# Patient Record
Sex: Female | Born: 1955 | Race: White | Hispanic: No | Marital: Single | Smoking: Current every day smoker
Health system: Southern US, Community
[De-identification: ages and names within clinical notes are randomized; demographics above are authoritative.]

## PROBLEM LIST (undated history)

## (undated) DIAGNOSIS — M810 Age-related osteoporosis without current pathological fracture: Secondary | ICD-10-CM

## (undated) HISTORY — PX: TOTAL HIP ARTHROPLASTY: SHX124

## (undated) HISTORY — PX: COLONOSCOPY: SHX174

---

## 2000-12-27 ENCOUNTER — Emergency Department (HOSPITAL_COMMUNITY): Admission: EM | Admit: 2000-12-27 | Discharge: 2000-12-28 | Payer: Self-pay | Admitting: Emergency Medicine

## 2000-12-28 ENCOUNTER — Encounter: Payer: Self-pay | Admitting: Emergency Medicine

## 2001-06-19 ENCOUNTER — Encounter: Payer: Self-pay | Admitting: Unknown Physician Specialty

## 2001-06-19 ENCOUNTER — Ambulatory Visit (HOSPITAL_COMMUNITY): Admission: RE | Admit: 2001-06-19 | Discharge: 2001-06-19 | Payer: Self-pay | Admitting: Unknown Physician Specialty

## 2005-11-01 ENCOUNTER — Other Ambulatory Visit: Admission: RE | Admit: 2005-11-01 | Discharge: 2005-11-01 | Payer: Self-pay | Admitting: Family Medicine

## 2016-04-16 DIAGNOSIS — E785 Hyperlipidemia, unspecified: Secondary | ICD-10-CM | POA: Insufficient documentation

## 2016-04-16 DIAGNOSIS — Z8659 Personal history of other mental and behavioral disorders: Secondary | ICD-10-CM | POA: Insufficient documentation

## 2016-04-16 DIAGNOSIS — F17200 Nicotine dependence, unspecified, uncomplicated: Secondary | ICD-10-CM | POA: Insufficient documentation

## 2016-04-16 NOTE — Progress Notes (Signed)
Office Visit Note  Patient: Lori Zimmerman             Date of Birth: [birth year]           MRN: [id number]             PCP: Kirstie Peri, MD Referring: Kirstie Peri, MD Visit Date: 04/19/2016 Occupation: Hair dresser    Subjective:  Pain in joints   History of Present Illness: Lori Zimmerman is a 61 y.o. female seen in consultation per request of her PCP. According to patient her symptoms a started well she was 61 years old with left hip pain. She states the symptoms gradually got worse. At age 77 she moved to [facility] and then was seen by her GYN who did x-rays and told her that she had hip arthritis. Later she was seen at [facility] orthopedics and was diagnosed with left hip joints severe arthritis. She had a hip joint injection under fluoroscopy. Surgery was not advised. 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 She was also given a course of prednisone. She states recently she's been having increased hip joint pain. Dr. Sherryll Burger gave her cortisone injection to the trochanteric area which helped her to some extent. She also complains of discomfort in her feet and has difficulty walking. She denies any joint swelling. None of the other joints are painful. She states she has to work long hours and she quit working in April 2017 due to joint discomfort. She is a still working as a Interior and spatial designer.  Activities of Daily Living:  Patient reports morning stiffness for 1 hour.   Patient Reports nocturnal pain.  Difficulty dressing/grooming: Denies Difficulty climbing stairs: Reports Difficulty getting out of chair: Reports Difficulty using hands for taps, buttons, cutlery, and/or writing: Denies   Review of Systems  Constitutional: Positive for fatigue. Negative for night sweats, weight gain, weight loss and weakness.  HENT: Positive for mouth dryness. Negative for mouth sores, trouble swallowing, trouble  swallowing and nose dryness.   Eyes: Negative for pain, redness, visual disturbance and dryness.  Respiratory: Negative for cough, shortness of breath and difficulty breathing.   Cardiovascular: Negative for chest pain, palpitations, hypertension, irregular heartbeat and swelling in legs/feet.  Gastrointestinal: Negative for blood in stool, constipation and diarrhea.  Endocrine: Negative for increased urination.  Genitourinary: Negative for vaginal dryness.  Musculoskeletal: Positive for arthralgias, joint pain and morning stiffness. Negative for joint swelling, myalgias, muscle weakness, muscle tenderness and myalgias.  Skin: Negative for color change, rash, hair loss, skin tightness, ulcers and sensitivity to sunlight.  Allergic/Immunologic: Negative for susceptible to infections.  Neurological: Negative for dizziness, memory loss and night sweats.  Hematological: Negative for swollen glands.  Psychiatric/Behavioral: Positive for depressed mood and sleep disturbance. The patient is nervous/anxious.     PMFS History:  Patient Active Problem List   Diagnosis Date Noted  . Dyslipidemia 04/16/2016  . History of anxiety 04/16/2016  . Smoker 04/16/2016    History reviewed. No pertinent past medical history.  History reviewed. No pertinent family history. History reviewed. No pertinent surgical history. Social History   Social History Narrative  . No narrative on file     Objective: Vital Signs: Resp 14   Ht 5\' 4"  (1.626 m)   Wt 162 lb (73.5 kg)   BMI 27.81 kg/m    Physical Exam  Constitutional: She is oriented to person, place, and time. She appears well-developed and well-nourished.  HENT:  Head: Normocephalic and atraumatic.  Eyes: Conjunctivae and EOM are normal.  Neck: Normal range of motion.  Cardiovascular: Normal rate, regular rhythm, normal heart sounds and intact distal pulses.   Pulmonary/Chest: Effort normal and breath sounds normal.  Abdominal: Soft. Bowel  sounds are normal.  Lymphadenopathy:    She has no cervical adenopathy.  Neurological: She is alert and oriented to person, place, and time.  Skin: Skin is warm and dry. Capillary refill takes less than 2 seconds.  Psychiatric: She has a normal mood and affect. Her behavior is normal.  Nursing note and vitals reviewed.    Musculoskeletal Exam: C-spine and thoracic spine good range of motion she has limited in full range of motion of her lumbar spine. No SI joint tenderness was noted. Shoulder joints elbow joints are good range of motion. She has PIP/DIP thickening in her hands with no synovitis no MCP joint synovitis was noted. Left hip joint had very limited range of motion. Right knee joint was  swollen and warm without any effusion. Ankle joints MTPs PIPs with good range of motion with no synovitis.  CDAI Exam: No CDAI exam completed.    Investigation: Findings:  09/26/2015 LDL 162, CBC normal    Imaging: Xr Hip Unilat W Or W/o Pelvis 2-3 Views Left  Result Date: 04/19/2016 Severe left hip joint narrowing. Impression: Findings consistent with severe osteoarthritis of the left hip  Xr Hand 2 View Left  Result Date: 04/19/2016 Osteopenia was noted. She has DIP PIP narrowing bilaterally consistent with osteoarthritis no MCP joint narrowing or erosive changes were noted. Impression findings consistent with osteoarthritis of the hand  Xr Hand 2 View Right  Result Date: 04/19/2016 Osteopenia was noted. She has DIP PIP narrowing bilaterally consistent with osteoarthritis no MCP joint narrowing or erosive changes were noted. Impression findings consistent with osteoarthritis of the hand  Xr Knee 3 View Right  Result Date: 04/19/2016 Mild medial compartment narrowing was noted in both knees. Right knee joint revealed intercondylar osteophytes. Mild patellofemoral narrowing was noted. No chondrocalcinosis was noted. Impression mild osteoarthritis of the knee joint was noted.  Xr Lumbar  Spine 2-3 Views  Result Date: 04/19/2016 Mild scoliosis was noted. She has multilevel spondylosis. Anterior spurring was noted. Facet joint arthropathy was noted. Some wedging of L1 and L2 was noted. Impression: Multilevel spondylosis and scoliosis of lumbar spine   Speciality Comments: No specialty comments available.    Procedures:  No procedures performed Allergies: Sulfa antibiotics   Assessment / Plan:     Visit Diagnoses: Pain in left hip -she has painful very limited range of motion. Plan: XR HIP UNILAT W OR W/O PELVIS 2-3 VIEWS LEFT. Severe osteoarthritis of left hip joint was noted.. Detailed discussion regarding total hip replacement. She states she will schedule appointment increased her orthopedics.  Chronic pain of right knee -she has some warmth and swelling. Plan: XR KNEE 3 VIEW RIGHT. She is only mild osteoarthritis in her knee joints I believe her right knee joint pain is most likely related to the left hip joint osteoarthritis  Pain in both hands - clinical findings are consistent with osteoarthritis Plan: XR Hand 2 View Right, XR Hand 2 View Left. 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 She does have this disease and the scoliosis she also has wedging of L1 and L2.   I recommend obtaining DEXA scan  as she is postmenopausal. Which she can get through her PCPs office.  Other fatigue  Dyslipidemia  History of anxiety: She is on Zoloft.  Smoker : Smoking cessation discussed.   Orders: Orders Placed This Encounter  Procedures  . XR KNEE 3 VIEW RIGHT  . XR HIP UNILAT W OR W/O PELVIS 2-3 VIEWS LEFT  . XR Hand 2 View Right  . XR Hand 2 View Left  . XR Lumbar Spine 2-3 Views   No orders of the defined types were placed in this encounter.   Face-to-face time spent with patient was 45 minutes. 50% of time was spent in counseling and  coordination of care.  Follow-Up Instructions: Return if symptoms worsen or fail to improve, for Osteoarthritis.   Pollyann SavoyShaili Tynan Boesel, MD  Note - This record has been created using Animal nutritionistDragon software.  Chart creation errors have been sought, but may not always  have been located. Such creation errors do not reflect on  the standard of medical care.

## 2016-04-19 ENCOUNTER — Ambulatory Visit (INDEPENDENT_AMBULATORY_CARE_PROVIDER_SITE_OTHER): Payer: Self-pay

## 2016-04-19 ENCOUNTER — Ambulatory Visit (INDEPENDENT_AMBULATORY_CARE_PROVIDER_SITE_OTHER): Payer: BLUE CROSS/BLUE SHIELD | Admitting: Rheumatology

## 2016-04-19 ENCOUNTER — Encounter: Payer: Self-pay | Admitting: Rheumatology

## 2016-04-19 VITALS — BP 132/82 | HR 78 | Resp 14 | Ht 64.0 in | Wt 162.0 lb

## 2016-04-19 DIAGNOSIS — E785 Hyperlipidemia, unspecified: Secondary | ICD-10-CM

## 2016-04-19 DIAGNOSIS — F17200 Nicotine dependence, unspecified, uncomplicated: Secondary | ICD-10-CM

## 2016-04-19 DIAGNOSIS — M545 Low back pain: Secondary | ICD-10-CM

## 2016-04-19 DIAGNOSIS — M79641 Pain in right hand: Secondary | ICD-10-CM | POA: Diagnosis not present

## 2016-04-19 DIAGNOSIS — M25561 Pain in right knee: Secondary | ICD-10-CM

## 2016-04-19 DIAGNOSIS — M25552 Pain in left hip: Secondary | ICD-10-CM | POA: Diagnosis not present

## 2016-04-19 DIAGNOSIS — Z8659 Personal history of other mental and behavioral disorders: Secondary | ICD-10-CM | POA: Diagnosis not present

## 2016-04-19 DIAGNOSIS — M79642 Pain in left hand: Secondary | ICD-10-CM

## 2016-04-19 DIAGNOSIS — R5383 Other fatigue: Secondary | ICD-10-CM | POA: Diagnosis not present

## 2016-04-19 DIAGNOSIS — G8929 Other chronic pain: Secondary | ICD-10-CM | POA: Diagnosis not present

## 2016-04-19 NOTE — Patient Instructions (Signed)
Please schedule appointment with the orthopedics for left total hip replacement.  Please schedule appointment with your PCP for bone density scan.

## 2016-04-29 ENCOUNTER — Telehealth: Payer: Self-pay | Admitting: Rheumatology

## 2016-04-29 NOTE — Telephone Encounter (Signed)
Patient is requetting her xrays from last visit be put  On a disc for her. She has [facility].

## 2016-05-21 ENCOUNTER — Ambulatory Visit: Payer: Self-pay | Admitting: Rheumatology

## 2019-02-08 ENCOUNTER — Ambulatory Visit: Payer: Self-pay | Admitting: *Deleted

## 2019-02-08 NOTE — Telephone Encounter (Signed)
Pt reports body aches, dry cough, headache, "Stuffiness", reports mild SOB with exertion only. Reports afebrile.  Pt has covid test scheduled for tomorrow AM. Symptom tier reviewed with pt as well as symptoms that warrant ED eval. Pt is treating with OTC meds. Advised to CB if any additional questions arise.

## 2019-02-08 NOTE — Telephone Encounter (Signed)
  Reason for Disposition . COVID-19 Testing, questions about  Answer Assessment - Initial Assessment Questions 1. COVID-19 DIAGNOSIS: "Who made your Coronavirus (COVID-19) diagnosis?" "Was it confirmed by a positive lab test?" If not diagnosed by a HCP, ask "Are there lots of cases (community spread) where you live?" (See public health department website, if unsure)    Health care worker 2. COVID-19 EXPOSURE: "Was there any known exposure to COVID before the symptoms began?" CDC Definition of close contact: within 6 feet (2 meters) for a total of 15 minutes or more over a 24-hour period.     yes 3. ONSET: "When did the COVID-19 symptoms start?"      weekend 4. WORST SYMPTOM: "What is your worst symptom?" (e.g., cough, fever, shortness of breath, muscle aches)     Body aches 5. COUGH: "Do you have a cough?" If so, ask: "How bad is the cough?"      moderate 6. FEVER: "Do you have a fever?" If so, ask: "What is your temperature, how was it measured, and when did it start?"    no 7. RESPIRATORY STATUS: "Describe your breathing?" (e.g., shortness of breath, wheezing, unable to speak)     mild 8. BETTER-SAME-WORSE: "Are you getting better, staying the same or getting worse compared to yesterday?"  If getting worse, ask, "In what way?"     same ER SYMPTOMS: "Do you have any other symptoms?"  (e.g., chills, fatigue, headache, loss of smell or taste, muscle pain, sore throat; new loss of smell or taste especially support the diagnosis of COVID-19)      Headache, muscle pain  Protocols used: CORONAVIRUS (COVID-19) DIAGNOSED OR SUSPECTED-A-AH

## 2020-03-20 ENCOUNTER — Other Ambulatory Visit (HOSPITAL_COMMUNITY): Payer: Self-pay | Admitting: Internal Medicine

## 2020-03-20 DIAGNOSIS — Z1231 Encounter for screening mammogram for malignant neoplasm of breast: Secondary | ICD-10-CM

## 2020-04-03 ENCOUNTER — Ambulatory Visit (HOSPITAL_COMMUNITY)
Admission: RE | Admit: 2020-04-03 | Discharge: 2020-04-03 | Disposition: A | Discharge status: Home / Self Care | Payer: 59 | Source: Ambulatory Visit | Attending: Internal Medicine | Admitting: Internal Medicine

## 2020-04-03 ENCOUNTER — Other Ambulatory Visit: Payer: Self-pay

## 2020-04-03 DIAGNOSIS — Z1231 Encounter for screening mammogram for malignant neoplasm of breast: Secondary | ICD-10-CM | POA: Diagnosis not present

## 2021-04-05 DIAGNOSIS — F419 Anxiety disorder, unspecified: Secondary | ICD-10-CM | POA: Diagnosis not present

## 2021-04-05 DIAGNOSIS — Z6832 Body mass index (BMI) 32.0-32.9, adult: Secondary | ICD-10-CM | POA: Diagnosis not present

## 2021-04-05 DIAGNOSIS — Z299 Encounter for prophylactic measures, unspecified: Secondary | ICD-10-CM | POA: Diagnosis not present

## 2021-04-05 DIAGNOSIS — K219 Gastro-esophageal reflux disease without esophagitis: Secondary | ICD-10-CM | POA: Diagnosis not present

## 2021-04-05 DIAGNOSIS — F17210 Nicotine dependence, cigarettes, uncomplicated: Secondary | ICD-10-CM | POA: Diagnosis not present

## 2021-04-05 DIAGNOSIS — E7800 Pure hypercholesterolemia, unspecified: Secondary | ICD-10-CM | POA: Diagnosis not present

## 2021-04-05 DIAGNOSIS — F339 Major depressive disorder, recurrent, unspecified: Secondary | ICD-10-CM | POA: Diagnosis not present

## 2021-05-23 ENCOUNTER — Other Ambulatory Visit (HOSPITAL_COMMUNITY): Payer: Self-pay | Admitting: Internal Medicine

## 2021-05-23 DIAGNOSIS — Z1231 Encounter for screening mammogram for malignant neoplasm of breast: Secondary | ICD-10-CM

## 2021-06-01 ENCOUNTER — Ambulatory Visit (HOSPITAL_COMMUNITY)
Admission: RE | Admit: 2021-06-01 | Discharge: 2021-06-01 | Disposition: A | Discharge status: Home / Self Care | Payer: PPO | Source: Ambulatory Visit | Attending: Internal Medicine | Admitting: Internal Medicine

## 2021-06-01 DIAGNOSIS — Z1231 Encounter for screening mammogram for malignant neoplasm of breast: Secondary | ICD-10-CM | POA: Diagnosis not present

## 2021-07-10 DIAGNOSIS — E559 Vitamin D deficiency, unspecified: Secondary | ICD-10-CM | POA: Diagnosis not present

## 2021-07-10 DIAGNOSIS — Z1339 Encounter for screening examination for other mental health and behavioral disorders: Secondary | ICD-10-CM | POA: Diagnosis not present

## 2021-07-10 DIAGNOSIS — Z7189 Other specified counseling: Secondary | ICD-10-CM | POA: Diagnosis not present

## 2021-07-10 DIAGNOSIS — Z1331 Encounter for screening for depression: Secondary | ICD-10-CM | POA: Diagnosis not present

## 2021-07-10 DIAGNOSIS — Z299 Encounter for prophylactic measures, unspecified: Secondary | ICD-10-CM | POA: Diagnosis not present

## 2021-07-10 DIAGNOSIS — Z6832 Body mass index (BMI) 32.0-32.9, adult: Secondary | ICD-10-CM | POA: Diagnosis not present

## 2021-07-10 DIAGNOSIS — E7800 Pure hypercholesterolemia, unspecified: Secondary | ICD-10-CM | POA: Diagnosis not present

## 2021-07-10 DIAGNOSIS — Z0000 Encounter for general adult medical examination without abnormal findings: Secondary | ICD-10-CM | POA: Diagnosis not present

## 2021-07-10 DIAGNOSIS — Z79899 Other long term (current) drug therapy: Secondary | ICD-10-CM | POA: Diagnosis not present

## 2021-07-10 DIAGNOSIS — R5383 Other fatigue: Secondary | ICD-10-CM | POA: Diagnosis not present

## 2021-10-09 DIAGNOSIS — Z0000 Encounter for general adult medical examination without abnormal findings: Secondary | ICD-10-CM | POA: Diagnosis not present

## 2021-10-09 DIAGNOSIS — K219 Gastro-esophageal reflux disease without esophagitis: Secondary | ICD-10-CM | POA: Diagnosis not present

## 2021-10-09 DIAGNOSIS — Z6832 Body mass index (BMI) 32.0-32.9, adult: Secondary | ICD-10-CM | POA: Diagnosis not present

## 2021-10-09 DIAGNOSIS — F339 Major depressive disorder, recurrent, unspecified: Secondary | ICD-10-CM | POA: Diagnosis not present

## 2021-10-09 DIAGNOSIS — Z299 Encounter for prophylactic measures, unspecified: Secondary | ICD-10-CM | POA: Diagnosis not present

## 2021-10-09 DIAGNOSIS — R350 Frequency of micturition: Secondary | ICD-10-CM | POA: Diagnosis not present

## 2021-10-09 DIAGNOSIS — E7800 Pure hypercholesterolemia, unspecified: Secondary | ICD-10-CM | POA: Diagnosis not present

## 2021-10-22 DIAGNOSIS — Z23 Encounter for immunization: Secondary | ICD-10-CM | POA: Diagnosis not present

## 2021-10-23 DIAGNOSIS — E2839 Other primary ovarian failure: Secondary | ICD-10-CM | POA: Diagnosis not present

## 2021-10-25 DIAGNOSIS — J069 Acute upper respiratory infection, unspecified: Secondary | ICD-10-CM | POA: Diagnosis not present

## 2021-10-25 DIAGNOSIS — Z299 Encounter for prophylactic measures, unspecified: Secondary | ICD-10-CM | POA: Diagnosis not present

## 2021-10-25 DIAGNOSIS — R059 Cough, unspecified: Secondary | ICD-10-CM | POA: Diagnosis not present

## 2021-10-25 DIAGNOSIS — R070 Pain in throat: Secondary | ICD-10-CM | POA: Diagnosis not present

## 2021-11-06 DIAGNOSIS — H40053 Ocular hypertension, bilateral: Secondary | ICD-10-CM | POA: Diagnosis not present

## 2022-01-07 DIAGNOSIS — H40053 Ocular hypertension, bilateral: Secondary | ICD-10-CM | POA: Diagnosis not present

## 2022-02-19 DIAGNOSIS — H25093 Other age-related incipient cataract, bilateral: Secondary | ICD-10-CM | POA: Diagnosis not present

## 2022-02-19 DIAGNOSIS — H40003 Preglaucoma, unspecified, bilateral: Secondary | ICD-10-CM | POA: Diagnosis not present

## 2022-02-19 DIAGNOSIS — H25813 Combined forms of age-related cataract, bilateral: Secondary | ICD-10-CM | POA: Diagnosis not present

## 2022-03-12 DIAGNOSIS — H25813 Combined forms of age-related cataract, bilateral: Secondary | ICD-10-CM | POA: Diagnosis not present

## 2022-03-12 DIAGNOSIS — H40033 Anatomical narrow angle, bilateral: Secondary | ICD-10-CM | POA: Diagnosis not present

## 2022-03-12 DIAGNOSIS — H40003 Preglaucoma, unspecified, bilateral: Secondary | ICD-10-CM | POA: Diagnosis not present

## 2022-04-09 DIAGNOSIS — K219 Gastro-esophageal reflux disease without esophagitis: Secondary | ICD-10-CM | POA: Diagnosis not present

## 2022-04-09 DIAGNOSIS — Z299 Encounter for prophylactic measures, unspecified: Secondary | ICD-10-CM | POA: Diagnosis not present

## 2022-04-09 DIAGNOSIS — R0602 Shortness of breath: Secondary | ICD-10-CM | POA: Diagnosis not present

## 2022-04-09 DIAGNOSIS — F17210 Nicotine dependence, cigarettes, uncomplicated: Secondary | ICD-10-CM | POA: Diagnosis not present

## 2022-04-09 DIAGNOSIS — R350 Frequency of micturition: Secondary | ICD-10-CM | POA: Diagnosis not present

## 2022-04-16 NOTE — H&P (Signed)
Surgical History & Physical  Patient Name: Lori Zimmerman  DOB: August 30, 1955  Surgery: Cataract extraction with intraocular lens implant phacoemulsification; Right Eye Surgeon: Ronn Melena MD Surgery Date: 04/26/2022 Pre-Op Date: 03/12/2022  HPI: A 40 Yr. old female patient who presents to complete glaucoma work up and have IOL measurements for cataract eval. She has no changes from her last visit. She does endorse glare with driving at night OU. This is negatively affecting the patient's quality of life and the patient is unable to function adequately in life with the current level of vision. No eye pain, redness, discharge.  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  Social Current some day smoker of Cigarettes   Medication Citracal D, Diclofenac, Effexor, Emergen, Famotidine, Rosuvastatin  Sx/Procedures Hip Replacement   Drug Allergies  Sulfa  History & Physical: Heent: cataracts NECK: supple without bruits LUNGS: lungs clear to auscultation CV: regular rate and rhythm Abdomen: soft and non-tender  Impression & Plan: Assessment: 1.  CATARACT AGE-RELATED COMBINED FORMS; Both Eyes (H25.813) 2.  GLAUCOMA SUSPECT; Both Eyes (H40.003) 3.  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  Cataracts are visually significant and account for the patient's complaints. Discussed all risks, benefits, procedures and  recovery, including infection, loss of vision and eye, need for glasses after surgery or additional procedures. Patient understands changing glasses will not improve vision. Patient indicated understanding of procedure. All questions answered. Patient desires to have surgery, recommend phacoemulsification with intraocular lens. Patient to have preliminary testing necessary (Argos/IOL Master, Mac OCT, TOPO) Educational materials provided.  Patient voices trouble with glare with night driving but also with narrow angle OU. Reasonable to move forward with cataract extraction to help open angle and prevent risk of angle closure per the Eagle Study  Plan: - Proceed with surgery OD followed by OS - Rayone Lens with best distance target - No fuchs, no prior eye surgery, no DM - ok with lying flat - Omidria, combo drop or 3 drops separately  2.  Glaucoma Suspect: - with thick cornea, moderately narrow angles, and elevated IOP - optic nerve, OCT RNFL, HVF reassuring - IOP better today  Plan: - Proceed with cataract surgery to open angle and assess IOP afterwards  Current gtts: None Intolerances: None Surgical Hx: None  Family Hx: Mother with glaucoma, Sister and Father with RP  Tmax: 55/25 Tcurrent: 18/20 Last DFE: 02/19/22 Pachy: 627/628  OCT (02/19/22): OD: full, ave rnfl thickness 95 OS: full, ave rnfl thickness 95  VF 24-2 (03/12/2022) OD: Full OU, no depression OS: Full OU, no depression  3.  High hyperopia OU, glasses per Dr. Rosana Hoes  4.  Can see SS in one quadrant of each eye, otherwise PTM

## 2022-04-18 ENCOUNTER — Encounter (HOSPITAL_COMMUNITY)
Admission: RE | Admit: 2022-04-18 | Discharge: 2022-04-18 | Disposition: A | Discharge status: Home / Self Care | Payer: 59 | Source: Ambulatory Visit | Attending: Optometry | Admitting: Optometry

## 2022-04-18 ENCOUNTER — Encounter (HOSPITAL_COMMUNITY): Payer: Self-pay

## 2022-04-18 HISTORY — DX: Age-related osteoporosis without current pathological fracture: M81.0

## 2022-05-08 ENCOUNTER — Other Ambulatory Visit (HOSPITAL_COMMUNITY): Payer: Self-pay | Admitting: Internal Medicine

## 2022-05-08 DIAGNOSIS — Z1231 Encounter for screening mammogram for malignant neoplasm of breast: Secondary | ICD-10-CM

## 2022-05-15 ENCOUNTER — Other Ambulatory Visit (HOSPITAL_COMMUNITY): Payer: Self-pay | Admitting: Internal Medicine

## 2022-05-15 ENCOUNTER — Ambulatory Visit (HOSPITAL_COMMUNITY)
Admission: RE | Admit: 2022-05-15 | Discharge: 2022-05-15 | Disposition: A | Discharge status: Home / Self Care | Payer: 59 | Source: Ambulatory Visit | Attending: Internal Medicine | Admitting: Internal Medicine

## 2022-05-15 DIAGNOSIS — R079 Chest pain, unspecified: Secondary | ICD-10-CM | POA: Diagnosis not present

## 2022-05-15 DIAGNOSIS — R0602 Shortness of breath: Secondary | ICD-10-CM

## 2022-05-24 ENCOUNTER — Encounter (HOSPITAL_COMMUNITY)
Admission: RE | Admit: 2022-05-24 | Discharge: 2022-05-24 | Disposition: A | Discharge status: Home / Self Care | Payer: 59 | Source: Ambulatory Visit | Attending: Optometry | Admitting: Optometry

## 2022-05-24 DIAGNOSIS — H2511 Age-related nuclear cataract, right eye: Secondary | ICD-10-CM | POA: Diagnosis not present

## 2022-05-31 ENCOUNTER — Ambulatory Visit (HOSPITAL_BASED_OUTPATIENT_CLINIC_OR_DEPARTMENT_OTHER): Payer: 59 | Admitting: Anesthesiology

## 2022-05-31 ENCOUNTER — Encounter (HOSPITAL_COMMUNITY)
Admission: RE | Disposition: A | Discharge status: Home / Self Care | Payer: Self-pay | Source: Home / Self Care | Attending: Optometry

## 2022-05-31 ENCOUNTER — Ambulatory Visit (HOSPITAL_COMMUNITY): Payer: 59 | Admitting: Anesthesiology

## 2022-05-31 ENCOUNTER — Ambulatory Visit (HOSPITAL_COMMUNITY)
Admission: RE | Admit: 2022-05-31 | Discharge: 2022-05-31 | Disposition: A | Discharge status: Home / Self Care | Payer: 59 | Attending: Optometry | Admitting: Optometry

## 2022-05-31 DIAGNOSIS — F419 Anxiety disorder, unspecified: Secondary | ICD-10-CM | POA: Diagnosis not present

## 2022-05-31 DIAGNOSIS — K219 Gastro-esophageal reflux disease without esophagitis: Secondary | ICD-10-CM | POA: Diagnosis not present

## 2022-05-31 DIAGNOSIS — H5203 Hypermetropia, bilateral: Secondary | ICD-10-CM | POA: Insufficient documentation

## 2022-05-31 DIAGNOSIS — F17210 Nicotine dependence, cigarettes, uncomplicated: Secondary | ICD-10-CM

## 2022-05-31 DIAGNOSIS — F17200 Nicotine dependence, unspecified, uncomplicated: Secondary | ICD-10-CM | POA: Diagnosis not present

## 2022-05-31 DIAGNOSIS — H2511 Age-related nuclear cataract, right eye: Secondary | ICD-10-CM | POA: Diagnosis not present

## 2022-05-31 DIAGNOSIS — H40033 Anatomical narrow angle, bilateral: Secondary | ICD-10-CM | POA: Diagnosis not present

## 2022-05-31 DIAGNOSIS — H25813 Combined forms of age-related cataract, bilateral: Secondary | ICD-10-CM | POA: Insufficient documentation

## 2022-05-31 DIAGNOSIS — H40003 Preglaucoma, unspecified, bilateral: Secondary | ICD-10-CM | POA: Insufficient documentation

## 2022-05-31 DIAGNOSIS — H25811 Combined forms of age-related cataract, right eye: Secondary | ICD-10-CM

## 2022-05-31 HISTORY — PX: CATARACT EXTRACTION W/PHACO: SHX586

## 2022-05-31 SURGERY — PHACOEMULSIFICATION, CATARACT, WITH IOL INSERTION
Anesthesia: Monitor Anesthesia Care | Site: Eye | Laterality: Right

## 2022-05-31 MED ORDER — POVIDONE-IODINE 5 % OP SOLN
OPHTHALMIC | Status: DC | PRN
  Administered 2022-05-31: 1 via OPHTHALMIC

## 2022-05-31 MED ORDER — TETRACAINE HCL 0.5 % OP SOLN
1.0000 [drp] | OPHTHALMIC | Status: AC
  Administered 2022-05-31 (×3): 1 [drp] via OPHTHALMIC

## 2022-05-31 MED ORDER — MIDAZOLAM HCL 2 MG/2ML IJ SOLN
INTRAMUSCULAR | Status: AC
  Filled 2022-05-31: qty 2

## 2022-05-31 MED ORDER — STERILE WATER FOR IRRIGATION IR SOLN
Status: DC | PRN
  Administered 2022-05-31: 250 mL

## 2022-05-31 MED ORDER — PHENYLEPHRINE-KETOROLAC 1-0.3 % IO SOLN
INTRAOCULAR | Status: AC
  Filled 2022-05-31: qty 4

## 2022-05-31 MED ORDER — PHENYLEPHRINE HCL 2.5 % OP SOLN
1.0000 [drp] | OPHTHALMIC | Status: AC
  Administered 2022-05-31 (×3): 1 [drp] via OPHTHALMIC

## 2022-05-31 MED ORDER — PHENYLEPHRINE-KETOROLAC 1-0.3 % IO SOLN
INTRAOCULAR | Status: DC | PRN
  Administered 2022-05-31: 500 mL via OPHTHALMIC

## 2022-05-31 MED ORDER — TROPICAMIDE 1 % OP SOLN
1.0000 [drp] | OPHTHALMIC | Status: AC
  Administered 2022-05-31 (×3): 1 [drp] via OPHTHALMIC

## 2022-05-31 MED ORDER — BSS IO SOLN
INTRAOCULAR | Status: DC | PRN
  Administered 2022-05-31: 15 mL via INTRAOCULAR

## 2022-05-31 MED ORDER — LIDOCAINE HCL (PF) 1 % IJ SOLN
INTRAMUSCULAR | Status: DC | PRN
  Administered 2022-05-31: 1 mL

## 2022-05-31 MED ORDER — MIDAZOLAM HCL 5 MG/5ML IJ SOLN
INTRAMUSCULAR | Status: DC | PRN
  Administered 2022-05-31: 2 mg via INTRAVENOUS

## 2022-05-31 MED ORDER — LIDOCAINE HCL 3.5 % OP GEL
1.0000 | Freq: Once | OPHTHALMIC | Status: AC
  Administered 2022-05-31: 1 via OPHTHALMIC

## 2022-05-31 MED ORDER — SIGHTPATH DOSE#1 NA HYALUR & NA CHOND-NA HYALUR IO KIT
PACK | INTRAOCULAR | Status: DC | PRN
  Administered 2022-05-31: 1 via OPHTHALMIC

## 2022-05-31 MED ORDER — MIDAZOLAM HCL 2 MG/2ML IJ SOLN
2.0000 mg | Freq: Once | INTRAMUSCULAR | Status: AC
  Administered 2022-05-31: 2 mg via INTRAVENOUS

## 2022-05-31 SURGICAL SUPPLY — 15 items
CATARACT SUITE SIGHTPATH (MISCELLANEOUS) ×1 IMPLANT
CLOTH BEACON ORANGE TIMEOUT ST (SAFETY) ×1 IMPLANT
DRSG TEGADERM 4X4.75 (GAUZE/BANDAGES/DRESSINGS) ×1 IMPLANT
EYE SHIELD UNIVERSAL CLEAR (GAUZE/BANDAGES/DRESSINGS) IMPLANT
FEE CATARACT SUITE SIGHTPATH (MISCELLANEOUS) ×1 IMPLANT
GLOVE BIOGEL PI IND STRL 7.0 (GLOVE) ×2 IMPLANT
LENS IOL RAYNER 24.5 (Intraocular Lens) ×1 IMPLANT
LENS IOL RAYONE EMV 24.5 (Intraocular Lens) IMPLANT
NDL HYPO 18GX1.5 BLUNT FILL (NEEDLE) ×1 IMPLANT
NEEDLE HYPO 18GX1.5 BLUNT FILL (NEEDLE) ×1 IMPLANT
PAD ARMBOARD 7.5X6 YLW CONV (MISCELLANEOUS) ×1 IMPLANT
RING MALYGIN 7.0 (MISCELLANEOUS) IMPLANT
SYR TB 1ML LL NO SAFETY (SYRINGE) ×1 IMPLANT
TAPE SURG TRANSPORE 1 IN (GAUZE/BANDAGES/DRESSINGS) IMPLANT
WATER STERILE IRR 250ML POUR (IV SOLUTION) ×1 IMPLANT

## 2022-05-31 NOTE — Anesthesia Postprocedure Evaluation (Signed)
Anesthesia Post Note  Patient: Chayil Gantt  Procedure(s) Performed: CATARACT EXTRACTION PHACO AND INTRAOCULAR LENS PLACEMENT (IOC) (Right: Eye)  Patient location during evaluation: Phase II Anesthesia Type: MAC Level of consciousness: awake and alert and oriented Pain management: pain level controlled Vital Signs Assessment: post-procedure vital signs reviewed and stable Respiratory status: spontaneous breathing, nonlabored ventilation and respiratory function stable Cardiovascular status: blood pressure returned to baseline and stable Postop Assessment: no apparent nausea or vomiting Anesthetic complications: no  No notable events documented.   Last Vitals:  Vitals:   05/31/22 0745 05/31/22 0819  BP: 118/85 115/78  Pulse: 86 87  Resp: 15 20  Temp:  36.6 C  SpO2: 95% 99%    Last Pain:  Vitals:   05/31/22 0819  TempSrc: Oral  PainSc: 0-No pain                 Arthor Gorter C Hjalmar Ballengee

## 2022-05-31 NOTE — Anesthesia Preprocedure Evaluation (Signed)
Anesthesia Evaluation  Patient identified by MRN, date of birth, ID band Patient awake    Reviewed: Allergy & Precautions, H&P , NPO status , Patient's Chart, lab work & pertinent test results  Airway Mallampati: II  TM Distance: >3 FB Neck ROM: Full    Dental  (+) Dental Advisory Given, Caps   Pulmonary Current Smoker and Patient abstained from smoking.   Pulmonary exam normal breath sounds clear to auscultation       Cardiovascular negative cardio ROS Normal cardiovascular exam Rhythm:Regular Rate:Normal     Neuro/Psych  PSYCHIATRIC DISORDERS Anxiety     negative neurological ROS     GI/Hepatic Neg liver ROS,GERD  Medicated and Controlled,,  Endo/Other  negative endocrine ROS    Renal/GU negative Renal ROS  negative genitourinary   Musculoskeletal negative musculoskeletal ROS (+)    Abdominal   Peds negative pediatric ROS (+)  Hematology negative hematology ROS (+)   Anesthesia Other Findings   Reproductive/Obstetrics negative OB ROS                             Anesthesia Physical Anesthesia Plan  ASA: 2  Anesthesia Plan: MAC   Post-op Pain Management: Minimal or no pain anticipated   Induction: Intravenous  PONV Risk Score and Plan: 0 and Treatment may vary due to age or medical condition  Airway Management Planned: Natural Airway and Nasal Cannula  Additional Equipment:   Intra-op Plan:   Post-operative Plan:   Informed Consent: I have reviewed the patients History and Physical, chart, labs and discussed the procedure including the risks, benefits and alternatives for the proposed anesthesia with the patient or authorized representative who has indicated his/her understanding and acceptance.     Dental advisory given  Plan Discussed with: CRNA and Surgeon  Anesthesia Plan Comments:        Anesthesia Quick Evaluation

## 2022-05-31 NOTE — Transfer of Care (Signed)
Immediate Anesthesia Transfer of Care Note  Patient: Lori Zimmerman  Procedure(s) Performed: CATARACT EXTRACTION PHACO AND INTRAOCULAR LENS PLACEMENT (IOC) (Right: Eye)  Patient Location: PACU  Anesthesia Type:MAC  Level of Consciousness: awake, alert , oriented, and patient cooperative  Airway & Oxygen Therapy: Patient Spontanous Breathing  Post-op Assessment: Report given to RN, Post -op Vital signs reviewed and stable, and Patient moving all extremities X 4  Post vital signs: Reviewed and stable  Last Vitals:  Vitals Value Taken Time  BP /   Temp wnl   Pulse 85   Resp 17   SpO2 99     Last Pain:  Vitals:   05/31/22 0707  TempSrc: Oral  PainSc: 0-No pain         Complications: No notable events documented.

## 2022-05-31 NOTE — Op Note (Signed)
Date of procedure: 05/31/22  Pre-operative diagnosis: Visually significant age-related nuclear cataract, Right Eye (H25.11)  Post-operative diagnosis: Visually significant age-related nuclear cataract, Right Eye  Procedure: Removal of cataract via phacoemulsification and insertion of intra-ocular lens Rayner [id number] +24.5D into the capsular bag of the Right Eye  Attending surgeon: Pecolia Ades, MD  Anesthesia: MAC, Topical Akten  Complications: None  Estimated Blood Loss: <73mL (minimal)  Specimens: None  Implants:  Implant Name Type Inv. Item Serial No. Manufacturer Lot No. LRB No. Used Action  LENS IOL RAYNER 24.5 - [id number] Intraocular Lens LENS IOL RAYNER 24.5  SIGHTPATH [id number] Right 1 Implanted    Indications:  Visually significant age-related cataract, Right Eye  Procedure:  The patient was seen and identified in the pre-operative area. The operative eye was identified and dilated.  The operative eye was marked.  Topical anesthesia was administered to the operative eye.     The patient was then to the operative suite and placed in the supine position.  A timeout was performed confirming the patient, procedure to be performed, and all other relevant information.   The patient's face was prepped and draped in the usual fashion for intra-ocular surgery.  A lid speculum was placed into the operative eye and the surgical microscope moved into place and focused.  A superotemporal paracentesis was created using a 20 gauge paracentesis blade.  BSS mixed with Omidria, followed by 1% lidocaine was injected into the anterior chamber.  Viscoelastic was injected into the anterior chamber.  A temporal clear-corneal main wound incision was created using a 2.48mm microkeratome.  A continuous curvilinear capsulorrhexis was initiated using an irrigating cystitome and completed using capsulorrhexis forceps.  Hydrodissection and hydrodeliniation were performed.  Viscoelastic was injected into  the anterior chamber.  A phacoemulsification handpiece and a chopper as a second instrument were used to remove the nucleus and epinucleus. The irrigation/aspiration handpiece was used to remove any remaining cortical material.   The capsular bag was reinflated with viscoelastic, checked, and found to be intact.  The intraocular lens was inserted into the capsular bag.  The irrigation/aspiration handpiece was used to remove any remaining viscoelastic.  The clear corneal wound and paracentesis wounds were then hydrated and checked with Weck-Cels to be watertight.  The lid-speculum and drape was removed, and the patient's face was cleaned with a wet and dry 4x4.  Moxifloxacin drops were instilled onto the eye. A clear shield was taped over the eye. The patient was taken to the post-operative care unit in good condition, having tolerated the procedure well.  Post-Op Instructions: The patient will follow up at [facility] for a same day post-operative evaluation and will receive all other orders and instructions.

## 2022-05-31 NOTE — Discharge Instructions (Signed)
Please discharge patient when stable, will follow up today with Dr. Niemah Schwebke at the [facility] office immediately following discharge.  Leave shield in place until visit.  All paperwork with discharge instructions will be given at the office.  [facility] Address:  [address]  [place], [place] [address]  Dr. Berdine Rasmusson's Phone: [phone]  

## 2022-05-31 NOTE — Interval H&P Note (Signed)
History and Physical Interval Note:  05/31/2022 7:25 AM  The H and P was reviewed and updated. The patient was examined.  No changes were found after exam.  The surgical eye was marked.  Lori Zimmerman

## 2022-06-05 ENCOUNTER — Encounter (HOSPITAL_COMMUNITY): Payer: Self-pay | Admitting: Optometry

## 2022-06-05 ENCOUNTER — Ambulatory Visit (HOSPITAL_COMMUNITY)
Admission: RE | Admit: 2022-06-05 | Discharge: 2022-06-05 | Disposition: A | Discharge status: Home / Self Care | Payer: 59 | Source: Ambulatory Visit | Attending: Internal Medicine | Admitting: Internal Medicine

## 2022-06-05 DIAGNOSIS — Z1231 Encounter for screening mammogram for malignant neoplasm of breast: Secondary | ICD-10-CM | POA: Diagnosis not present

## 2022-06-11 DIAGNOSIS — H2511 Age-related nuclear cataract, right eye: Secondary | ICD-10-CM | POA: Diagnosis not present

## 2022-06-17 NOTE — H&P (Signed)
Surgical History & Physical  Patient Name: Lori Zimmerman  DOB: [birth year]  Surgery: Cataract extraction with intraocular lens implant phacoemulsification; Left Eye Surgeon: Pecolia Ades MD Surgery Date: 06/28/2022 Pre-Op Date: 06/11/2022  HPI: A 50 Yr. old female patient present for 11 day post op OD. Patient is doing well. Having trouble because the difference between the two eyes. Her left eye brings everything closer to her and larger where the right eye makes it further away and smaller. Difficulties reading fine print and street signs. Difficulties reading captions on TV. This is negatively affecting the patient's quality of life and the patient is unable to function adequately in life with the current level of vision.  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  Social Current some day smoker of Cigarettes   Medication Moxifloxacin, Prednisolone acetate 1%,  Citracal D, Diclofenac, Effexor, Emergen, Famotidine, Rosuvastatin  Sx/Procedures Phaco c IOL OD,  Hip Replacement  Drug Allergies  Sulfa   History & Physical: Heent: cataract OS, PCL OD NECK: supple without bruits LUNGS: lungs clear to auscultation CV: regular rate and rhythm Abdomen: soft and non-tender  Impression & Plan: Assessment: 1.  CATARACT EXTRACTION STATUS; Right Eye (Z98.41) 2.  INTRAOCULAR LENS IOL ; Right Eye (Z96.1)  Plan: 1.  POW1.5 exam Doing well. All post-op precautions discussed, and instructions reviewed. Written instructions given. Start Moxifloxacin today instead of Ciprofloxacin due to sulfa allergy.  Finish PF drop as scheduled.  2.  See above

## 2022-06-19 ENCOUNTER — Encounter (HOSPITAL_COMMUNITY)
Admission: RE | Admit: 2022-06-19 | Discharge: 2022-06-19 | Disposition: A | Discharge status: Home / Self Care | Payer: 59 | Source: Ambulatory Visit | Attending: Optometry | Admitting: Optometry

## 2022-06-19 ENCOUNTER — Encounter (HOSPITAL_COMMUNITY): Payer: Self-pay

## 2022-06-19 NOTE — Pre-Procedure Instructions (Signed)
Attempted pre-op phone call. Left VM for him to call us back. 

## 2022-06-21 DIAGNOSIS — H2512 Age-related nuclear cataract, left eye: Secondary | ICD-10-CM | POA: Diagnosis not present

## 2022-06-28 ENCOUNTER — Ambulatory Visit (HOSPITAL_COMMUNITY): Payer: 59 | Admitting: Anesthesiology

## 2022-06-28 ENCOUNTER — Ambulatory Visit (HOSPITAL_BASED_OUTPATIENT_CLINIC_OR_DEPARTMENT_OTHER): Payer: 59 | Admitting: Anesthesiology

## 2022-06-28 ENCOUNTER — Encounter (HOSPITAL_COMMUNITY)
Admission: RE | Disposition: A | Discharge status: Home / Self Care | Payer: Self-pay | Source: Home / Self Care | Attending: Optometry

## 2022-06-28 ENCOUNTER — Encounter (HOSPITAL_COMMUNITY): Payer: Self-pay | Admitting: Optometry

## 2022-06-28 ENCOUNTER — Ambulatory Visit (HOSPITAL_COMMUNITY)
Admission: RE | Admit: 2022-06-28 | Discharge: 2022-06-28 | Disposition: A | Discharge status: Home / Self Care | Payer: 59 | Attending: Optometry | Admitting: Optometry

## 2022-06-28 DIAGNOSIS — Z9841 Cataract extraction status, right eye: Secondary | ICD-10-CM | POA: Diagnosis not present

## 2022-06-28 DIAGNOSIS — F17210 Nicotine dependence, cigarettes, uncomplicated: Secondary | ICD-10-CM | POA: Diagnosis not present

## 2022-06-28 DIAGNOSIS — F419 Anxiety disorder, unspecified: Secondary | ICD-10-CM

## 2022-06-28 DIAGNOSIS — H2512 Age-related nuclear cataract, left eye: Secondary | ICD-10-CM | POA: Insufficient documentation

## 2022-06-28 DIAGNOSIS — Z961 Presence of intraocular lens: Secondary | ICD-10-CM | POA: Insufficient documentation

## 2022-06-28 DIAGNOSIS — H25812 Combined forms of age-related cataract, left eye: Secondary | ICD-10-CM

## 2022-06-28 HISTORY — PX: CATARACT EXTRACTION W/PHACO: SHX586

## 2022-06-28 SURGERY — PHACOEMULSIFICATION, CATARACT, WITH IOL INSERTION
Anesthesia: Monitor Anesthesia Care | Site: Eye | Laterality: Left

## 2022-06-28 MED ORDER — PHENYLEPHRINE HCL 2.5 % OP SOLN
1.0000 [drp] | OPHTHALMIC | Status: AC | PRN
  Administered 2022-06-28 (×3): 1 [drp] via OPHTHALMIC

## 2022-06-28 MED ORDER — MIDAZOLAM HCL 2 MG/2ML IJ SOLN
INTRAMUSCULAR | Status: AC
  Filled 2022-06-28: qty 2

## 2022-06-28 MED ORDER — BSS IO SOLN
INTRAOCULAR | Status: DC | PRN
  Administered 2022-06-28: 15 mL via INTRAOCULAR

## 2022-06-28 MED ORDER — LIDOCAINE HCL 3.5 % OP GEL
1.0000 | Freq: Once | OPHTHALMIC | Status: AC
  Administered 2022-06-28: 1 via OPHTHALMIC

## 2022-06-28 MED ORDER — MIDAZOLAM HCL 2 MG/2ML IJ SOLN
2.0000 mg | Freq: Once | INTRAMUSCULAR | Status: AC
  Administered 2022-06-28: 2 mg via INTRAVENOUS

## 2022-06-28 MED ORDER — TROPICAMIDE 1 % OP SOLN
1.0000 [drp] | OPHTHALMIC | Status: AC | PRN
  Administered 2022-06-28 (×3): 1 [drp] via OPHTHALMIC

## 2022-06-28 MED ORDER — TETRACAINE HCL 0.5 % OP SOLN
1.0000 [drp] | OPHTHALMIC | Status: AC | PRN
  Administered 2022-06-28 (×3): 1 [drp] via OPHTHALMIC

## 2022-06-28 MED ORDER — LIDOCAINE HCL (PF) 1 % IJ SOLN
INTRAMUSCULAR | Status: DC | PRN
  Administered 2022-06-28: 2 mL

## 2022-06-28 MED ORDER — SIGHTPATH DOSE#1 NA HYALUR & NA CHOND-NA HYALUR IO KIT
PACK | INTRAOCULAR | Status: DC | PRN
  Administered 2022-06-28: 1 via OPHTHALMIC

## 2022-06-28 MED ORDER — POVIDONE-IODINE 5 % OP SOLN
OPHTHALMIC | Status: DC | PRN
  Administered 2022-06-28: 1 via OPHTHALMIC

## 2022-06-28 MED ORDER — STERILE WATER FOR IRRIGATION IR SOLN
Status: DC | PRN
  Administered 2022-06-28: 250 mL

## 2022-06-28 MED ORDER — PHENYLEPHRINE-KETOROLAC 1-0.3 % IO SOLN
INTRAOCULAR | Status: DC | PRN
  Administered 2022-06-28: 500 mL via OPHTHALMIC

## 2022-06-28 SURGICAL SUPPLY — 15 items
CATARACT SUITE SIGHTPATH (MISCELLANEOUS) ×1 IMPLANT
CLOTH BEACON ORANGE TIMEOUT ST (SAFETY) ×1 IMPLANT
DRSG TEGADERM 4X4.75 (GAUZE/BANDAGES/DRESSINGS) ×1 IMPLANT
EYE SHIELD UNIVERSAL CLEAR (GAUZE/BANDAGES/DRESSINGS) IMPLANT
FEE CATARACT SUITE SIGHTPATH (MISCELLANEOUS) ×1 IMPLANT
GLOVE BIOGEL PI IND STRL 7.0 (GLOVE) ×2 IMPLANT
LENS IOL RAYNER 25.5 (Intraocular Lens) ×1 IMPLANT
LENS IOL RAYONE EMV 25.5 (Intraocular Lens) IMPLANT
NDL HYPO 18GX1.5 BLUNT FILL (NEEDLE) ×1 IMPLANT
NEEDLE HYPO 18GX1.5 BLUNT FILL (NEEDLE) ×1 IMPLANT
PAD ARMBOARD 7.5X6 YLW CONV (MISCELLANEOUS) ×1 IMPLANT
RING MALYGIN 7.0 (MISCELLANEOUS) IMPLANT
SYR TB 1ML LL NO SAFETY (SYRINGE) ×1 IMPLANT
TAPE SURG TRANSPORE 1 IN (GAUZE/BANDAGES/DRESSINGS) IMPLANT
WATER STERILE IRR 250ML POUR (IV SOLUTION) ×1 IMPLANT

## 2022-06-28 NOTE — Transfer of Care (Signed)
Immediate Anesthesia Transfer of Care Note  Patient: Lori Zimmerman  Procedure(s) Performed: CATARACT EXTRACTION PHACO AND INTRAOCULAR LENS PLACEMENT (IOC) (Left: Eye)  Patient Location: Short Stay  Anesthesia Type:MAC  Level of Consciousness: awake, alert , and oriented  Airway & Oxygen Therapy: Patient Spontanous Breathing  Post-op Assessment: Report given to RN and Post -op Vital signs reviewed and stable  Post vital signs: Reviewed and stable  Last Vitals:  Vitals Value Taken Time  BP    Temp    Pulse    Resp    SpO2      Last Pain:  Vitals:   06/28/22 1008  PainSc: 0-No pain         Complications: No notable events documented.

## 2022-06-28 NOTE — Anesthesia Preprocedure Evaluation (Signed)
Anesthesia Evaluation  Patient identified by MRN, date of birth, ID band Patient awake    Reviewed: Allergy & Precautions, H&P , NPO status , Patient's Chart, lab work & pertinent test results  Airway Mallampati: II  TM Distance: >3 FB Neck ROM: Full    Dental  (+) Dental Advisory Given, Caps,  Crowns :   Pulmonary Current Smoker and Patient abstained from smoking.   Pulmonary exam normal breath sounds clear to auscultation       Cardiovascular negative cardio ROS Normal cardiovascular exam Rhythm:Regular Rate:Normal     Neuro/Psych   Anxiety     negative neurological ROS  negative psych ROS   GI/Hepatic negative GI ROS, Neg liver ROS,  Medicated and Controlled,,  Endo/Other  negative endocrine ROS    Renal/GU negative Renal ROS  negative genitourinary   Musculoskeletal negative musculoskeletal ROS (+)    Abdominal   Peds negative pediatric ROS (+)  Hematology negative hematology ROS (+)   Anesthesia Other Findings   Reproductive/Obstetrics negative OB ROS                              Anesthesia Physical Anesthesia Plan  ASA: 2  Anesthesia Plan: MAC   Post-op Pain Management: Minimal or no pain anticipated   Induction: Intravenous  PONV Risk Score and Plan: 0 and Treatment may vary due to age or medical condition  Airway Management Planned: Natural Airway and Nasal Cannula  Additional Equipment:   Intra-op Plan:   Post-operative Plan:   Informed Consent: I have reviewed the patients History and Physical, chart, labs and discussed the procedure including the risks, benefits and alternatives for the proposed anesthesia with the patient or authorized representative who has indicated his/her understanding and acceptance.     Dental advisory given  Plan Discussed with: CRNA and Surgeon  Anesthesia Plan Comments:         Anesthesia Quick Evaluation

## 2022-06-28 NOTE — Interval H&P Note (Signed)
History and Physical Interval Note:  06/28/2022 10:16 AM  The H and P was reviewed and updated. The patient was examined.  No changes were found after exam.  The surgical eye was marked.   Desman Polak

## 2022-06-28 NOTE — Discharge Instructions (Signed)
Please discharge patient when stable, will follow up today with Dr. Jeramiah Mccaughey at the [facility] [place] office immediately following discharge.  Leave shield in place until visit.  All paperwork with discharge instructions will be given at the office.  [facility] Address:  [address]  , [place] [address]  Dr. Grayland Daisey's Phone: [phone]  

## 2022-06-28 NOTE — Op Note (Signed)
Date of procedure: 06/28/22  Pre-operative diagnosis: Visually significant age-related nuclear cataract, Left Eye (H25.12)  Post-operative diagnosis: Visually significant age-related nuclear cataract, Left Eye  Procedure: Removal of cataract via phacoemulsification and insertion of intra-ocular lens Rayner [id number] +25.5D into the capsular bag of the Left Eye  Attending surgeon: Ronal Fear, MD  Anesthesia: MAC, Topical Akten  Complications: None  Estimated Blood Loss: <13mL (minimal)  Specimens: None  Implants:  Implant Name Type Inv. Item Serial No. Manufacturer Lot No. LRB No. Used Action  LENS IOL RAYNER 25.5 - [id number] Intraocular Lens LENS IOL RAYNER 25.5  [employer] [id number] Left 1 Implanted    Indications:  Visually significant age-related cataract, Left Eye  Procedure:  The patient was seen and identified in the pre-operative area. The operative eye was identified and dilated.  The operative eye was marked.  Topical anesthesia was administered to the operative eye.     The patient was then to the operative suite and placed in the supine position.  A timeout was performed confirming the patient, procedure to be performed, and all other relevant information.   The patient's face was prepped and draped in the usual fashion for intra-ocular surgery.  A lid speculum was placed into the operative eye and the surgical microscope moved into place and focused.  An inferotemporal paracentesis was created using a 20 gauge paracentesis blade.  BSS mixed with Omidria, followed by 1% lidocaine was injected into the anterior chamber.  Viscoelastic was injected into the anterior chamber.  A temporal clear-corneal main wound incision was created using a 2.68mm microkeratome.  A continuous curvilinear capsulorrhexis was initiated using an irrigating cystitome and completed using capsulorrhexis forceps.  Hydrodissection and hydrodeliniation were performed.  Viscoelastic was injected into the  anterior chamber.  A phacoemulsification handpiece and a chopper as a second instrument were used to remove the nucleus and epinucleus. The irrigation/aspiration handpiece was used to remove any remaining cortical material.   The capsular bag was reinflated with viscoelastic, checked, and found to be intact.  The intraocular lens was inserted into the capsular bag.  The irrigation/aspiration handpiece was used to remove any remaining viscoelastic.  The clear corneal wound and paracentesis wounds were then hydrated and checked with Weck-Cels to be watertight.  The lid-speculum and drape was removed, and the patient's face was cleaned with a wet and dry 4x4.  Maxitrol drops were instilled onto the eye. A clear shield was taped over the eye. The patient was taken to the post-operative care unit in good condition, having tolerated the procedure well.  Post-Op Instructions: The patient will follow up at [facility] for a same day post-operative evaluation and will receive all other orders and instructions.

## 2022-06-28 NOTE — Anesthesia Postprocedure Evaluation (Signed)
Anesthesia Post Note  Patient: Tyneshia Pasqual  Procedure(s) Performed: CATARACT EXTRACTION PHACO AND INTRAOCULAR LENS PLACEMENT (IOC) (Left: Eye)  Patient location during evaluation: Phase II Anesthesia Type: MAC Level of consciousness: awake and alert and oriented Pain management: pain level controlled Vital Signs Assessment: post-procedure vital signs reviewed and stable Respiratory status: spontaneous breathing, nonlabored ventilation and respiratory function stable Cardiovascular status: stable and blood pressure returned to baseline Postop Assessment: no apparent nausea or vomiting Anesthetic complications: no  No notable events documented.   Last Vitals:  Vitals:   06/28/22 1029 06/28/22 1110  BP:  128/79  Pulse: 79 69  Resp: 17 17  Temp:  36.6 C  SpO2: 99% 98%    Last Pain:  Vitals:   06/28/22 1110  TempSrc: Oral  PainSc: 0-No pain                 Albina Gosney C Sarabelle Genson

## 2022-06-28 NOTE — Anesthesia Procedure Notes (Signed)
Procedure Name: MAC Date/Time: 06/28/2022 10:51 AM  Performed by: Julian Reil, CRNAPre-anesthesia Checklist: Patient identified, Emergency Drugs available, Suction available and Patient being monitored Patient Re-evaluated:Patient Re-evaluated prior to induction Oxygen Delivery Method: Nasal cannula Placement Confirmation: positive ETCO2

## 2022-07-05 ENCOUNTER — Encounter (HOSPITAL_COMMUNITY): Payer: Self-pay | Admitting: Optometry

## 2022-07-12 DIAGNOSIS — Z299 Encounter for prophylactic measures, unspecified: Secondary | ICD-10-CM | POA: Diagnosis not present

## 2022-07-12 DIAGNOSIS — Z7189 Other specified counseling: Secondary | ICD-10-CM | POA: Diagnosis not present

## 2022-07-12 DIAGNOSIS — I700 Atherosclerosis of aorta: Secondary | ICD-10-CM | POA: Diagnosis not present

## 2022-07-12 DIAGNOSIS — Z0000 Encounter for general adult medical examination without abnormal findings: Secondary | ICD-10-CM | POA: Diagnosis not present

## 2022-08-11 DIAGNOSIS — R030 Elevated blood-pressure reading, without diagnosis of hypertension: Secondary | ICD-10-CM | POA: Diagnosis not present

## 2022-08-11 DIAGNOSIS — M25532 Pain in left wrist: Secondary | ICD-10-CM | POA: Diagnosis not present

## 2022-08-11 DIAGNOSIS — S52515A Nondisplaced fracture of left radial styloid process, initial encounter for closed fracture: Secondary | ICD-10-CM | POA: Diagnosis not present

## 2022-08-14 DIAGNOSIS — M25532 Pain in left wrist: Secondary | ICD-10-CM | POA: Diagnosis not present

## 2022-08-21 DIAGNOSIS — M25532 Pain in left wrist: Secondary | ICD-10-CM | POA: Diagnosis not present

## 2022-09-04 DIAGNOSIS — M25532 Pain in left wrist: Secondary | ICD-10-CM | POA: Diagnosis not present

## 2022-09-25 DIAGNOSIS — M25532 Pain in left wrist: Secondary | ICD-10-CM | POA: Diagnosis not present

## 2022-10-09 DIAGNOSIS — M25532 Pain in left wrist: Secondary | ICD-10-CM | POA: Diagnosis not present

## 2022-10-21 DIAGNOSIS — M6281 Muscle weakness (generalized): Secondary | ICD-10-CM | POA: Diagnosis not present

## 2022-10-21 DIAGNOSIS — M25532 Pain in left wrist: Secondary | ICD-10-CM | POA: Diagnosis not present

## 2022-10-21 DIAGNOSIS — M25632 Stiffness of left wrist, not elsewhere classified: Secondary | ICD-10-CM | POA: Diagnosis not present

## 2022-10-23 DIAGNOSIS — M6281 Muscle weakness (generalized): Secondary | ICD-10-CM | POA: Diagnosis not present

## 2022-10-23 DIAGNOSIS — M25632 Stiffness of left wrist, not elsewhere classified: Secondary | ICD-10-CM | POA: Diagnosis not present

## 2022-10-23 DIAGNOSIS — M25532 Pain in left wrist: Secondary | ICD-10-CM | POA: Diagnosis not present

## 2022-10-24 DIAGNOSIS — S52509A Unspecified fracture of the lower end of unspecified radius, initial encounter for closed fracture: Secondary | ICD-10-CM | POA: Diagnosis not present

## 2022-10-24 DIAGNOSIS — Z299 Encounter for prophylactic measures, unspecified: Secondary | ICD-10-CM | POA: Diagnosis not present

## 2022-10-24 DIAGNOSIS — R5383 Other fatigue: Secondary | ICD-10-CM | POA: Diagnosis not present

## 2022-10-24 DIAGNOSIS — E559 Vitamin D deficiency, unspecified: Secondary | ICD-10-CM | POA: Diagnosis not present

## 2022-10-24 DIAGNOSIS — Z0000 Encounter for general adult medical examination without abnormal findings: Secondary | ICD-10-CM | POA: Diagnosis not present

## 2022-10-24 DIAGNOSIS — Z79899 Other long term (current) drug therapy: Secondary | ICD-10-CM | POA: Diagnosis not present

## 2022-10-24 DIAGNOSIS — Z23 Encounter for immunization: Secondary | ICD-10-CM | POA: Diagnosis not present

## 2022-10-24 DIAGNOSIS — E7800 Pure hypercholesterolemia, unspecified: Secondary | ICD-10-CM | POA: Diagnosis not present

## 2022-10-29 DIAGNOSIS — M25632 Stiffness of left wrist, not elsewhere classified: Secondary | ICD-10-CM | POA: Diagnosis not present

## 2022-10-29 DIAGNOSIS — M25532 Pain in left wrist: Secondary | ICD-10-CM | POA: Diagnosis not present

## 2022-10-29 DIAGNOSIS — M6281 Muscle weakness (generalized): Secondary | ICD-10-CM | POA: Diagnosis not present

## 2022-11-01 DIAGNOSIS — M25832 Other specified joint disorders, left wrist: Secondary | ICD-10-CM | POA: Diagnosis not present

## 2022-11-01 DIAGNOSIS — S52532D Colles' fracture of left radius, subsequent encounter for closed fracture with routine healing: Secondary | ICD-10-CM | POA: Diagnosis not present

## 2022-11-01 DIAGNOSIS — S63502A Unspecified sprain of left wrist, initial encounter: Secondary | ICD-10-CM | POA: Diagnosis not present

## 2022-11-05 DIAGNOSIS — I700 Atherosclerosis of aorta: Secondary | ICD-10-CM | POA: Diagnosis not present

## 2022-11-05 DIAGNOSIS — R350 Frequency of micturition: Secondary | ICD-10-CM | POA: Diagnosis not present

## 2022-11-05 DIAGNOSIS — N390 Urinary tract infection, site not specified: Secondary | ICD-10-CM | POA: Diagnosis not present

## 2022-11-05 DIAGNOSIS — Z299 Encounter for prophylactic measures, unspecified: Secondary | ICD-10-CM | POA: Diagnosis not present

## 2022-11-05 DIAGNOSIS — M25532 Pain in left wrist: Secondary | ICD-10-CM | POA: Diagnosis not present

## 2022-11-05 DIAGNOSIS — M25632 Stiffness of left wrist, not elsewhere classified: Secondary | ICD-10-CM | POA: Diagnosis not present

## 2022-11-05 DIAGNOSIS — M6281 Muscle weakness (generalized): Secondary | ICD-10-CM | POA: Diagnosis not present

## 2022-11-14 DIAGNOSIS — M25552 Pain in left hip: Secondary | ICD-10-CM | POA: Diagnosis not present

## 2022-11-14 DIAGNOSIS — Z96642 Presence of left artificial hip joint: Secondary | ICD-10-CM | POA: Diagnosis not present

## 2022-11-18 DIAGNOSIS — M8458XA Pathological fracture in neoplastic disease, other specified site, initial encounter for fracture: Secondary | ICD-10-CM | POA: Diagnosis not present

## 2022-11-18 DIAGNOSIS — M8440XA Pathological fracture, unspecified site, initial encounter for fracture: Secondary | ICD-10-CM | POA: Diagnosis not present

## 2022-12-24 DIAGNOSIS — Z299 Encounter for prophylactic measures, unspecified: Secondary | ICD-10-CM | POA: Diagnosis not present

## 2022-12-24 DIAGNOSIS — N390 Urinary tract infection, site not specified: Secondary | ICD-10-CM | POA: Diagnosis not present

## 2022-12-24 DIAGNOSIS — R350 Frequency of micturition: Secondary | ICD-10-CM | POA: Diagnosis not present

## 2022-12-26 DIAGNOSIS — M25552 Pain in left hip: Secondary | ICD-10-CM | POA: Diagnosis not present

## 2022-12-26 DIAGNOSIS — Z96642 Presence of left artificial hip joint: Secondary | ICD-10-CM | POA: Diagnosis not present

## 2022-12-27 ENCOUNTER — Other Ambulatory Visit (HOSPITAL_COMMUNITY): Payer: Self-pay | Admitting: Orthopedic Surgery

## 2022-12-27 DIAGNOSIS — Z96642 Presence of left artificial hip joint: Secondary | ICD-10-CM

## 2023-01-07 DIAGNOSIS — M1990 Unspecified osteoarthritis, unspecified site: Secondary | ICD-10-CM | POA: Diagnosis not present

## 2023-01-07 DIAGNOSIS — Z299 Encounter for prophylactic measures, unspecified: Secondary | ICD-10-CM | POA: Diagnosis not present

## 2023-01-07 DIAGNOSIS — N390 Urinary tract infection, site not specified: Secondary | ICD-10-CM | POA: Diagnosis not present

## 2023-01-10 ENCOUNTER — Encounter (HOSPITAL_COMMUNITY): Payer: 59

## 2023-01-10 ENCOUNTER — Encounter (HOSPITAL_COMMUNITY): Payer: Self-pay

## 2023-01-14 DIAGNOSIS — Z299 Encounter for prophylactic measures, unspecified: Secondary | ICD-10-CM | POA: Diagnosis not present

## 2023-01-14 DIAGNOSIS — R52 Pain, unspecified: Secondary | ICD-10-CM | POA: Diagnosis not present

## 2023-01-14 DIAGNOSIS — R350 Frequency of micturition: Secondary | ICD-10-CM | POA: Diagnosis not present

## 2023-01-14 DIAGNOSIS — R1013 Epigastric pain: Secondary | ICD-10-CM | POA: Diagnosis not present

## 2023-01-14 DIAGNOSIS — I700 Atherosclerosis of aorta: Secondary | ICD-10-CM | POA: Diagnosis not present

## 2023-01-16 ENCOUNTER — Encounter (HOSPITAL_COMMUNITY)
Admission: RE | Admit: 2023-01-16 | Discharge: 2023-01-16 | Disposition: A | Discharge status: Home / Self Care | Payer: 59 | Source: Ambulatory Visit | Attending: Orthopedic Surgery | Admitting: Orthopedic Surgery

## 2023-01-16 DIAGNOSIS — Z96642 Presence of left artificial hip joint: Secondary | ICD-10-CM | POA: Insufficient documentation

## 2023-01-16 DIAGNOSIS — Z471 Aftercare following joint replacement surgery: Secondary | ICD-10-CM | POA: Diagnosis not present

## 2023-01-16 DIAGNOSIS — M25552 Pain in left hip: Secondary | ICD-10-CM | POA: Diagnosis not present

## 2023-01-16 MED ORDER — TECHNETIUM TC 99M MEDRONATE IV KIT
20.0000 | PACK | Freq: Once | INTRAVENOUS | Status: AC | PRN
  Administered 2023-01-16: 19.3 via INTRAVENOUS

## 2023-01-20 DIAGNOSIS — Z9081 Acquired absence of spleen: Secondary | ICD-10-CM | POA: Diagnosis not present

## 2023-01-20 DIAGNOSIS — R1013 Epigastric pain: Secondary | ICD-10-CM | POA: Diagnosis not present

## 2023-01-20 DIAGNOSIS — Z299 Encounter for prophylactic measures, unspecified: Secondary | ICD-10-CM | POA: Diagnosis not present

## 2023-01-20 DIAGNOSIS — L309 Dermatitis, unspecified: Secondary | ICD-10-CM | POA: Diagnosis not present

## 2023-01-20 DIAGNOSIS — I700 Atherosclerosis of aorta: Secondary | ICD-10-CM | POA: Diagnosis not present

## 2023-01-20 DIAGNOSIS — M810 Age-related osteoporosis without current pathological fracture: Secondary | ICD-10-CM | POA: Diagnosis not present

## 2023-02-06 DIAGNOSIS — H43813 Vitreous degeneration, bilateral: Secondary | ICD-10-CM | POA: Diagnosis not present

## 2023-02-06 DIAGNOSIS — Z961 Presence of intraocular lens: Secondary | ICD-10-CM | POA: Diagnosis not present

## 2023-02-26 DIAGNOSIS — M7062 Trochanteric bursitis, left hip: Secondary | ICD-10-CM | POA: Diagnosis not present

## 2023-02-26 DIAGNOSIS — Z96642 Presence of left artificial hip joint: Secondary | ICD-10-CM | POA: Diagnosis not present

## 2023-04-21 DIAGNOSIS — J019 Acute sinusitis, unspecified: Secondary | ICD-10-CM | POA: Diagnosis not present

## 2023-04-21 DIAGNOSIS — Z0000 Encounter for general adult medical examination without abnormal findings: Secondary | ICD-10-CM | POA: Diagnosis not present

## 2023-04-21 DIAGNOSIS — I700 Atherosclerosis of aorta: Secondary | ICD-10-CM | POA: Diagnosis not present

## 2023-04-21 DIAGNOSIS — Z87891 Personal history of nicotine dependence: Secondary | ICD-10-CM | POA: Diagnosis not present

## 2023-04-21 DIAGNOSIS — Z299 Encounter for prophylactic measures, unspecified: Secondary | ICD-10-CM | POA: Diagnosis not present

## 2023-04-29 ENCOUNTER — Other Ambulatory Visit (HOSPITAL_COMMUNITY): Payer: Self-pay | Admitting: Internal Medicine

## 2023-04-29 DIAGNOSIS — Z1231 Encounter for screening mammogram for malignant neoplasm of breast: Secondary | ICD-10-CM

## 2023-05-05 DIAGNOSIS — L570 Actinic keratosis: Secondary | ICD-10-CM | POA: Diagnosis not present

## 2023-05-05 DIAGNOSIS — Z299 Encounter for prophylactic measures, unspecified: Secondary | ICD-10-CM | POA: Diagnosis not present

## 2023-05-05 DIAGNOSIS — L309 Dermatitis, unspecified: Secondary | ICD-10-CM | POA: Diagnosis not present

## 2023-06-06 ENCOUNTER — Ambulatory Visit (HOSPITAL_COMMUNITY)
Admission: RE | Admit: 2023-06-06 | Discharge: 2023-06-06 | Disposition: A | Discharge status: Home / Self Care | Source: Ambulatory Visit | Attending: Internal Medicine | Admitting: Internal Medicine

## 2023-06-06 DIAGNOSIS — Z1231 Encounter for screening mammogram for malignant neoplasm of breast: Secondary | ICD-10-CM | POA: Insufficient documentation

## 2023-07-14 DIAGNOSIS — I700 Atherosclerosis of aorta: Secondary | ICD-10-CM | POA: Diagnosis not present

## 2023-07-14 DIAGNOSIS — Z0000 Encounter for general adult medical examination without abnormal findings: Secondary | ICD-10-CM | POA: Diagnosis not present

## 2023-07-14 DIAGNOSIS — Z7189 Other specified counseling: Secondary | ICD-10-CM | POA: Diagnosis not present

## 2023-07-14 DIAGNOSIS — M810 Age-related osteoporosis without current pathological fracture: Secondary | ICD-10-CM | POA: Diagnosis not present

## 2023-07-14 DIAGNOSIS — Z299 Encounter for prophylactic measures, unspecified: Secondary | ICD-10-CM | POA: Diagnosis not present

## 2023-07-21 IMAGING — MG MM DIGITAL SCREENING BILAT W/ TOMO AND CAD
6 of 10 series · 6 of 30 positions shown · non-contrast
Comparison: Previous exam(s).

CLINICAL DATA: Screening.

EXAM:
DIGITAL SCREENING BILATERAL MAMMOGRAM WITH TOMOSYNTHESIS AND CAD
TECHNIQUE: Bilateral screening digital craniocaudal and mediolateral oblique
mammograms were obtained. Bilateral screening digital breast
tomosynthesis was performed. The images were evaluated with
computer-aided detection.

[R MLO synth-2D (1 of 2)]
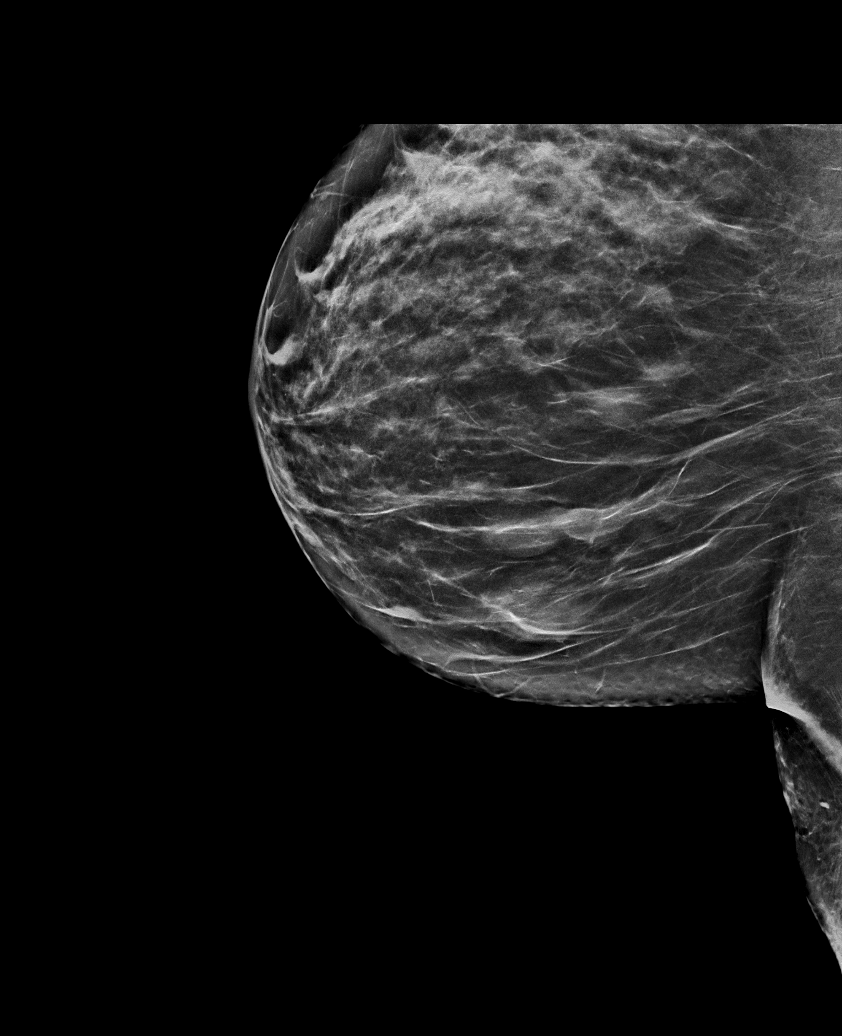

[R CC synth-2D]
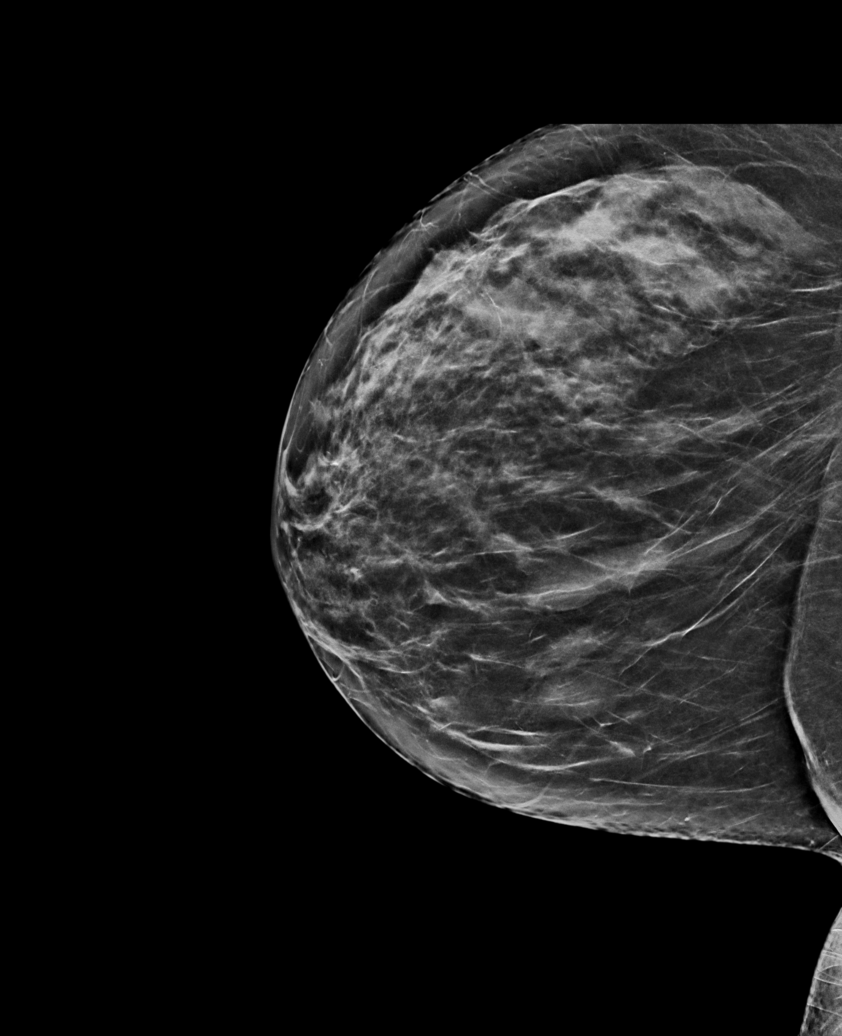

[L CC synth-2D]
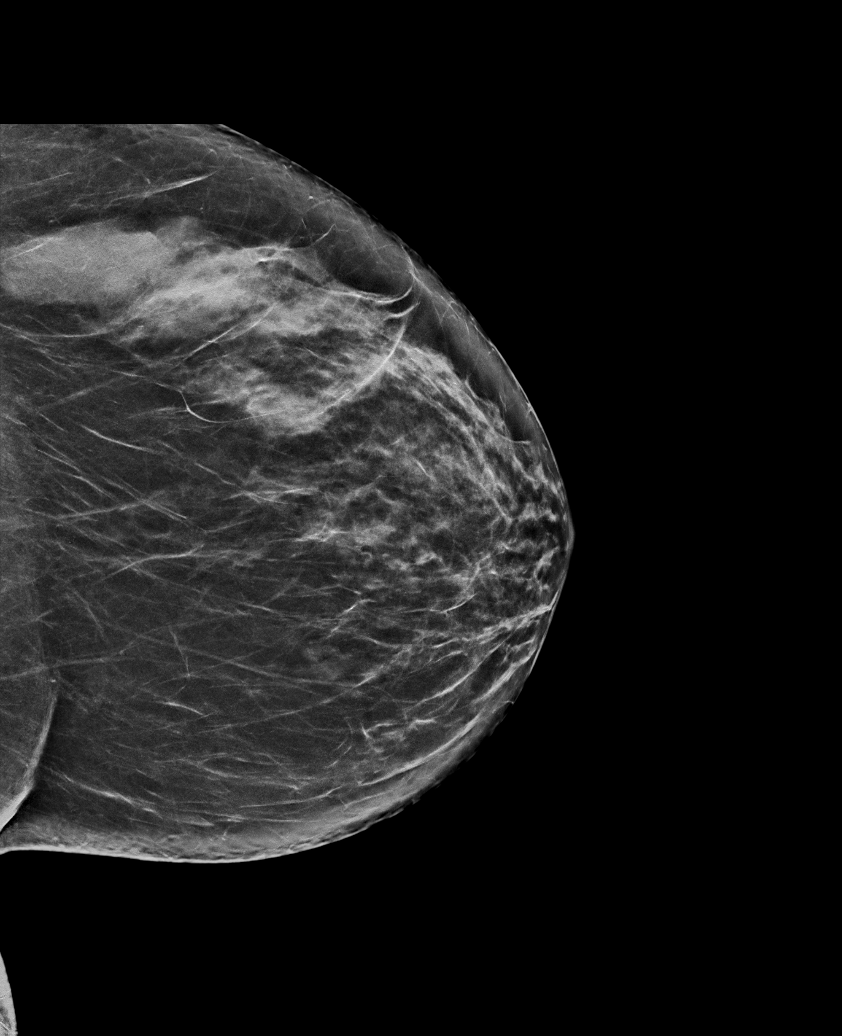

[R MLO synth-2D (2 of 2)]
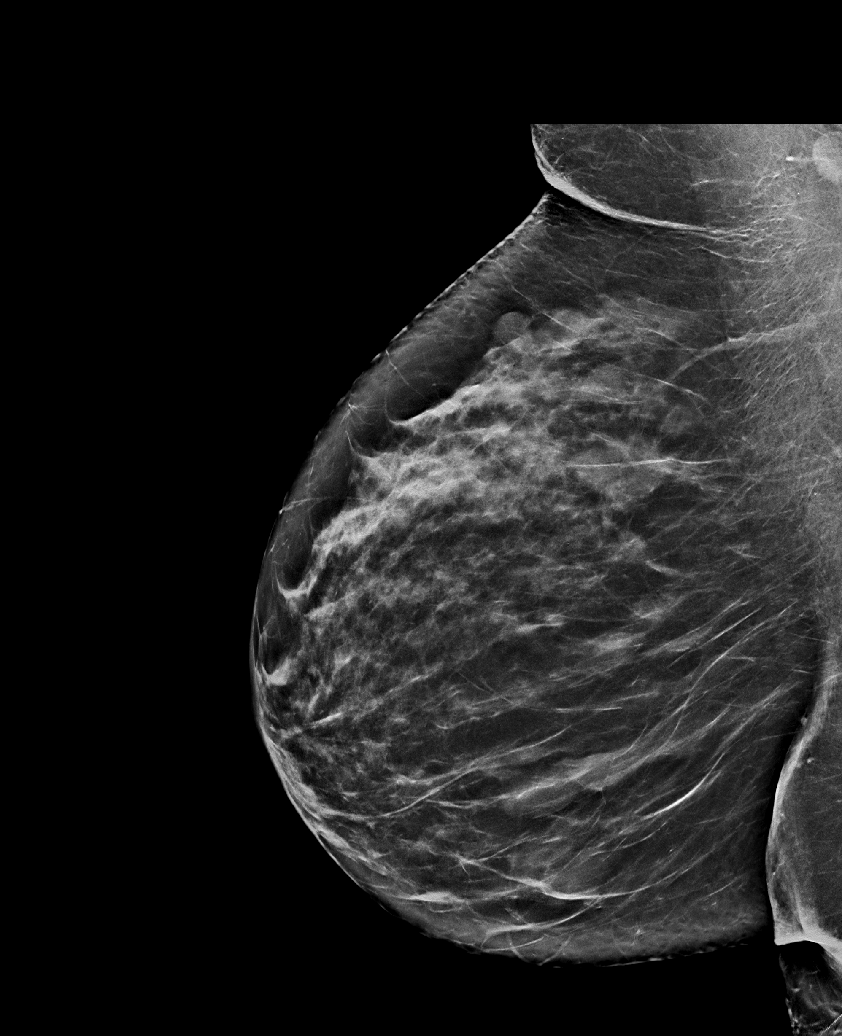

[L MLO synth-2D]
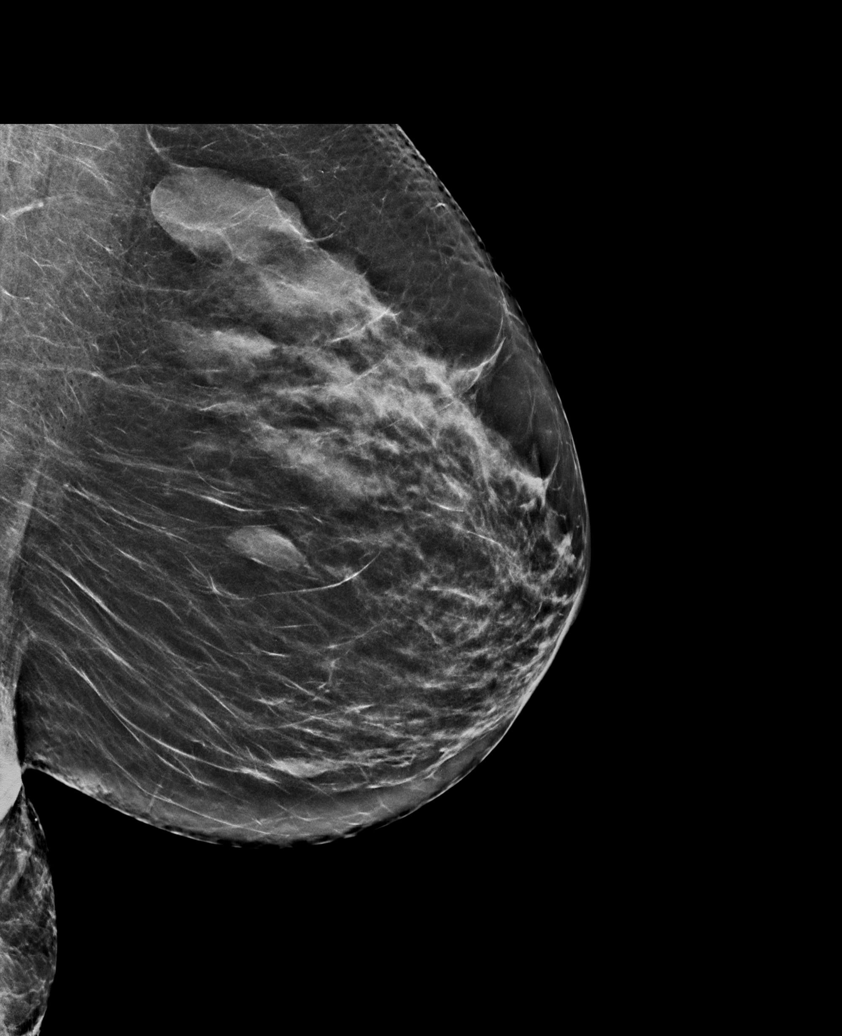

[R CC tomo · tomo slice 35/68.0]
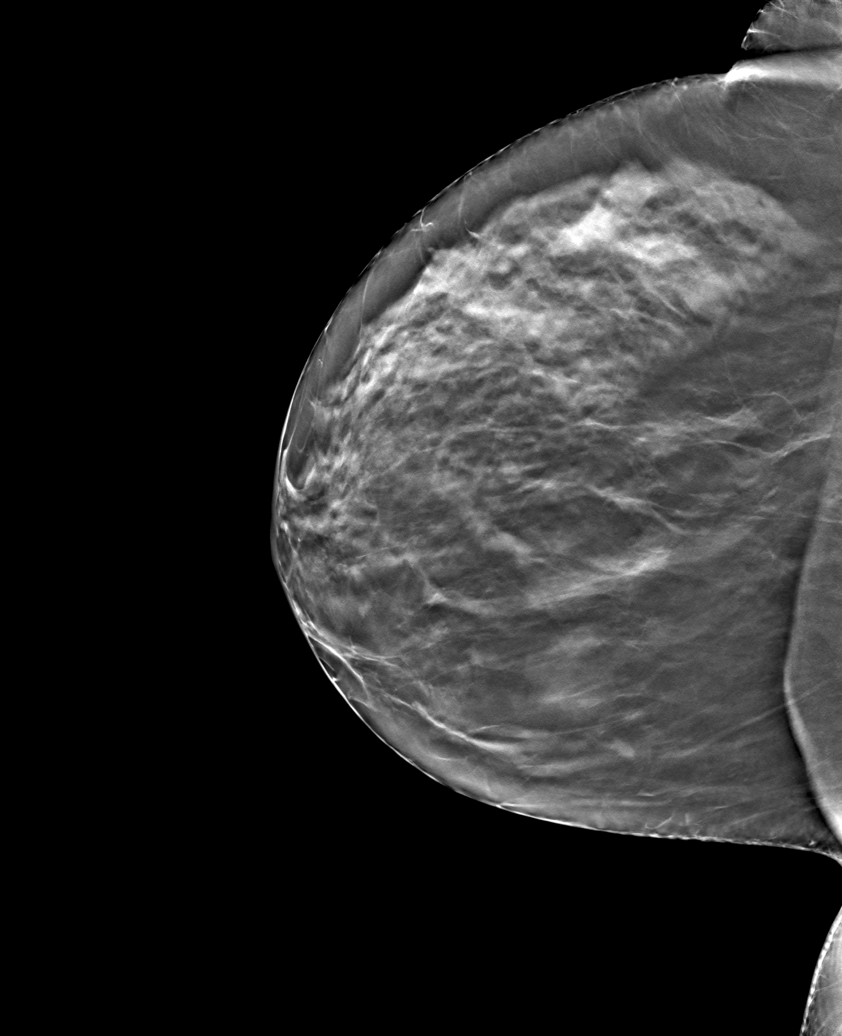

[6 of 30 positions shown; findings below may reference images not displayed]

ACR Breast Density Category c: The breast tissue is heterogeneously
dense, which may obscure small masses.
FINDINGS: There are no findings suspicious for malignancy.
IMPRESSION: No mammographic evidence of malignancy. A result letter of this
screening mammogram will be mailed directly to the patient.

RECOMMENDATION:
Screening mammogram in one year. (Code:[id number])

BI-RADS CATEGORY  1: Negative.

## 2023-08-05 DIAGNOSIS — H16223 Keratoconjunctivitis sicca, not specified as Sjogren's, bilateral: Secondary | ICD-10-CM | POA: Diagnosis not present

## 2023-08-05 DIAGNOSIS — Z961 Presence of intraocular lens: Secondary | ICD-10-CM | POA: Diagnosis not present

## 2023-08-05 DIAGNOSIS — H40003 Preglaucoma, unspecified, bilateral: Secondary | ICD-10-CM | POA: Diagnosis not present

## 2023-08-05 DIAGNOSIS — H43813 Vitreous degeneration, bilateral: Secondary | ICD-10-CM | POA: Diagnosis not present

## 2023-08-26 DIAGNOSIS — M810 Age-related osteoporosis without current pathological fracture: Secondary | ICD-10-CM | POA: Diagnosis not present

## 2023-08-26 DIAGNOSIS — R21 Rash and other nonspecific skin eruption: Secondary | ICD-10-CM | POA: Diagnosis not present

## 2023-08-26 DIAGNOSIS — B354 Tinea corporis: Secondary | ICD-10-CM | POA: Diagnosis not present

## 2023-08-26 DIAGNOSIS — Z299 Encounter for prophylactic measures, unspecified: Secondary | ICD-10-CM | POA: Diagnosis not present

## 2023-10-13 DIAGNOSIS — H43812 Vitreous degeneration, left eye: Secondary | ICD-10-CM | POA: Diagnosis not present

## 2023-11-26 DIAGNOSIS — Z299 Encounter for prophylactic measures, unspecified: Secondary | ICD-10-CM | POA: Diagnosis not present

## 2023-11-26 DIAGNOSIS — I700 Atherosclerosis of aorta: Secondary | ICD-10-CM | POA: Diagnosis not present
